# Patient Record
Sex: Female | Born: 1940
Health system: Southern US, Community
[De-identification: ages and names within clinical notes are randomized; demographics above are authoritative.]

## PROBLEM LIST (undated history)

## (undated) DIAGNOSIS — E119 Type 2 diabetes mellitus without complications: Secondary | ICD-10-CM

## (undated) DIAGNOSIS — M1990 Unspecified osteoarthritis, unspecified site: Secondary | ICD-10-CM

## (undated) DIAGNOSIS — E039 Hypothyroidism, unspecified: Secondary | ICD-10-CM

## (undated) DIAGNOSIS — Z860100 Personal history of colon polyps, unspecified: Secondary | ICD-10-CM

## (undated) DIAGNOSIS — K219 Gastro-esophageal reflux disease without esophagitis: Secondary | ICD-10-CM

## (undated) DIAGNOSIS — I10 Essential (primary) hypertension: Secondary | ICD-10-CM

## (undated) DIAGNOSIS — E7800 Pure hypercholesterolemia, unspecified: Secondary | ICD-10-CM

## (undated) DIAGNOSIS — K5790 Diverticulosis of intestine, part unspecified, without perforation or abscess without bleeding: Secondary | ICD-10-CM

## (undated) HISTORY — PX: COLONOSCOPY: SHX174

## (undated) HISTORY — DX: Personal history of colonic polyps: Z86.010

## (undated) HISTORY — DX: Unspecified osteoarthritis, unspecified site: M19.90

## (undated) HISTORY — DX: Personal history of colon polyps, unspecified: Z86.0100

## (undated) HISTORY — DX: Pure hypercholesterolemia, unspecified: E78.00

## (undated) HISTORY — PX: CATARACT EXTRACTION: SUR2

## (undated) HISTORY — DX: Gastro-esophageal reflux disease without esophagitis: K21.9

## (undated) HISTORY — DX: Essential (primary) hypertension: I10

## (undated) HISTORY — DX: Hypothyroidism, unspecified: E03.9

## (undated) HISTORY — DX: Type 2 diabetes mellitus without complications: E11.9

## (undated) HISTORY — DX: Diverticulosis of intestine, part unspecified, without perforation or abscess without bleeding: K57.90

---

## 2020-02-09 ENCOUNTER — Telehealth: Payer: Self-pay | Admitting: Gastroenterology

## 2020-02-09 NOTE — Telephone Encounter (Signed)
HI Dr. Havery Moros,   We received a referral form the patient PCP for a repeat colonoscopy. Previous was Dr. Jake Bathe in 2016 diagnosis: Tubular adenoma of colon will be sending the report for review.   Please advise on scheduling.   Thank you

## 2020-02-14 ENCOUNTER — Encounter: Payer: Self-pay | Admitting: Gastroenterology

## 2020-03-20 NOTE — Telephone Encounter (Signed)
I never received the patients records to review. I don't have it in my office and there is nothing scanned into Epic.  Given her age, I should probably see her in clinic regardless of the findings to determine if she even wishes to have any further exams at all. Can you help book an office visit rather than waiting on records to arrive. Thanks

## 2020-03-20 NOTE — Telephone Encounter (Signed)
Hi Dr. Havery Moros,  Just wanted to follow up on records status.  Please and thank you

## 2020-03-20 NOTE — Telephone Encounter (Signed)
Absolutely, she is on your schedule.   Thanks

## 2020-04-11 ENCOUNTER — Encounter: Payer: Self-pay | Admitting: Gastroenterology

## 2020-04-11 ENCOUNTER — Ambulatory Visit: Payer: Medicare PPO | Admitting: Gastroenterology

## 2020-04-11 VITALS — BP 120/70 | HR 62 | Ht 64.0 in | Wt 150.8 lb

## 2020-04-11 DIAGNOSIS — K219 Gastro-esophageal reflux disease without esophagitis: Secondary | ICD-10-CM

## 2020-04-11 DIAGNOSIS — Z86010 Personal history of colonic polyps: Secondary | ICD-10-CM | POA: Diagnosis not present

## 2020-04-11 MED ORDER — SUTAB 1479-225-188 MG PO TABS: 1.0000 | ORAL_TABLET | Freq: Once | ORAL | 0 refills | Status: AC

## 2020-04-11 NOTE — Patient Instructions (Addendum)
If you are age 80 or older, your body mass index should be between 23-30. Your Body mass index is 25.88 kg/m. If this is out of the aforementioned range listed, please consider follow up with your Primary Care Provider.  If you are age 67 or younger, your body mass index should be between 19-25. Your Body mass index is 25.88 kg/m. If this is out of the aformentioned range listed, please consider follow up with your Primary Care Provider.    You have been scheduled for a colonoscopy. Please follow written instructions given to you at your visit today.  Please pick up your prep supplies at the pharmacy within the next 1-3 days. If you use inhalers (even only as needed), please bring them with you on the day of your procedure.   We are giving you a sample of SUTAB today due to the nearness of your procedure.   Thank you for entrusting me with your care and for choosing [facility], Dr. Ileene Patrick

## 2020-04-11 NOTE — Progress Notes (Signed)
HPI :  80 year old female with a history of colon polyps, GERD, hypertension, hyperlipidemia, referred by Velna Hatchet MD to discuss colonoscopy in light of history of colon polyps.  The patient states she moved to [place] within the past year from [place].  She states over time she has been having colonoscopy every 3 to 5 years for multiple polyps she has had removed in the past.  Her colonoscopy report is available from her last exam only, done in October 2016, good prep noted and no polyps seen but her gastroenterologist recommended a repeat colonoscopy in 5 years due to her history of prior polyps removed.  The patient is hopeful to proceed with colonoscopy.  She has no family history of colon cancer, no bowel troubles.  She denies any blood in her stools.  She is otherwise in very good health.  She does take omeprazole 20 mg once daily for history of reflux.  She states this is worked very well for her over time to control her symptoms.  She has previously been on Zantac to which she developed swelling/angioedema and has an allergy listed to that.  She is also used excessive amounts of Tums in the past and she states she had problems with hypercalcemia and so she has been managing this with omeprazole for a long time.  She denies any dysphagia.  No family history of esophageal cancer.  She has had an EGD years ago and states she had no evidence of Barrett's esophagus that she is aware of.  He denies any history of chronic kidney disease.    Colonoscopy 01/23/2015 - [place] - Dr. Genella Mech - good prep, mild sigmoid diverticulosis, told to have another colonoscopy in 5 years due to "history of polyps"   Past Medical History:  Diagnosis Date  . Arthritis   . Diverticulosis   . GERD (gastroesophageal reflux disease)   . High cholesterol   . History of colon polyps   . Hypertension   . Hypothyroid      Past Surgical History:  Procedure Laterality Date  .  CATARACT EXTRACTION    . COLONOSCOPY     Family History  Problem Relation Age of Onset  . Healthy Mother   . Heart disease Father   . Healthy Sister   . Colon cancer Neg Hx   . Esophageal cancer Neg Hx   . Pancreatic cancer Neg Hx   . Stomach cancer Neg Hx   . Liver disease Neg Hx    Social History   Tobacco Use  . Smoking status: Never Smoker  . Smokeless tobacco: Never Used  Vaping Use  . Vaping Use: Never used  Substance Use Topics  . Alcohol use: Never  . Drug use: Never   Current Outpatient Medications  Medication Sig Dispense Refill  . amLODipine (NORVASC) 2.5 MG tablet Take 2.5 mg by mouth daily.    Marland Kitchen levothyroxine (SYNTHROID) 50 MCG tablet Take 50 mcg by mouth every other day. ALTERNATES WITH 75MCG TABLET    . levothyroxine (SYNTHROID) 75 MCG tablet Take 75 mcg by mouth every other day. ALTERNATES WITH 50 MCG TABLET    . omeprazole (PRILOSEC) 20 MG capsule Take 20 mg by mouth daily.    . simvastatin (ZOCOR) 10 MG tablet Take 10 mg by mouth daily.    . Sodium Sulfate-Mag Sulfate-KCl (SUTAB) [phone] MG TABS Take 1 kit by mouth once for 1 dose. 24 tablet 0   No current facility-administered medications for this visit.  Allergies  Allergen Reactions  . Zantac [Ranitidine Hcl] Anaphylaxis    Facial swelling. Denies dyspnea     Review of Systems: All systems reviewed and negative except where noted in HPI.   No results found for: WBC, HGB, HCT, MCV, PLT  No results found for: CREATININE, BUN, NA, K, CL, CO2  NO labs on file available  Physical Exam: BP 120/70   Pulse 62   Ht $R'5\' 4"'rA$  (1.626 m)   Wt 150 lb 12.8 oz (68.4 kg)   SpO2 96%   BMI 25.88 kg/m  Constitutional: Pleasant,well-developed, female in no acute distress. HEENT: Normocephalic and atraumatic. Conjunctivae are normal. No scleral icterus. Neck supple.  Cardiovascular: Normal rate, regular rhythm.  Pulmonary/chest: Effort normal and breath sounds normal.  Abdominal: Soft, nondistended,  nontender. . There are no masses palpable.  Extremities: no edema Lymphadenopathy: No cervical adenopathy noted. Neurological: Alert and oriented to person place and time. Skin: Skin is warm and dry. No rashes noted. Psychiatric: Normal mood and affect. Behavior is normal.   ASSESSMENT AND PLAN: 80 year old female here for reassessment the following:  History of colon polyps - numerous adenomas removed in the past per her report.  Her last colonoscopy was 5 years ago and she had no polyps however her gastroenterologist recommended a 5-year follow-up based on exams she has had in the past.  Patient strongly wishes to have another colonoscopy for surveillance purposes prior to stopping exams.  She is quite healthy without any significant comorbidities for her age, I think reasonable to proceed with 1 more colonoscopy.  I discussed risk and benefits of colonoscopy and anesthesia with her and she wanted to proceed, further recommendations pending results.  GERD - longstanding symptoms with prior EGD reportedly showing no evidence of Barrett's.  She has had an allergic reaction to Zantac, anxious about trying something else like Pepcid.  She has not done well with Tums in the past and states she requires PPI to control symptoms.  We discussed long-term risks associated with chronic PPI use, she understands, will continue therapy for now  Ocotillo Cellar, MD [facility]  CC: Velna Hatchet, MD

## 2020-04-12 ENCOUNTER — Encounter: Payer: Self-pay | Admitting: Gastroenterology

## 2020-04-12 ENCOUNTER — Other Ambulatory Visit: Payer: Self-pay

## 2020-04-12 ENCOUNTER — Ambulatory Visit (AMBULATORY_SURGERY_CENTER): Payer: Medicare PPO | Admitting: Gastroenterology

## 2020-04-12 VITALS — BP 106/61 | HR 53 | Temp 96.0°F | Resp 11 | Ht 64.0 in | Wt 150.0 lb

## 2020-04-12 DIAGNOSIS — D120 Benign neoplasm of cecum: Secondary | ICD-10-CM

## 2020-04-12 DIAGNOSIS — D128 Benign neoplasm of rectum: Secondary | ICD-10-CM

## 2020-04-12 DIAGNOSIS — D127 Benign neoplasm of rectosigmoid junction: Secondary | ICD-10-CM | POA: Diagnosis not present

## 2020-04-12 DIAGNOSIS — I10 Essential (primary) hypertension: Secondary | ICD-10-CM | POA: Diagnosis not present

## 2020-04-12 DIAGNOSIS — D123 Benign neoplasm of transverse colon: Secondary | ICD-10-CM | POA: Diagnosis not present

## 2020-04-12 DIAGNOSIS — K219 Gastro-esophageal reflux disease without esophagitis: Secondary | ICD-10-CM | POA: Diagnosis not present

## 2020-04-12 DIAGNOSIS — D126 Benign neoplasm of colon, unspecified: Secondary | ICD-10-CM | POA: Diagnosis not present

## 2020-04-12 DIAGNOSIS — Z86010 Personal history of colonic polyps: Secondary | ICD-10-CM | POA: Diagnosis not present

## 2020-04-12 MED ORDER — SODIUM CHLORIDE 0.9 % IV SOLN
500.0000 mL | Freq: Once | INTRAVENOUS | Status: DC
Start: 2020-04-12 — End: 2020-04-12

## 2020-04-12 NOTE — Progress Notes (Signed)
Report given to PACU, vss 

## 2020-04-12 NOTE — Progress Notes (Signed)
Called to room to assist during endoscopic procedure.  Patient ID and intended procedure confirmed with present staff. Received instructions for my participation in the procedure from the performing physician.  

## 2020-04-12 NOTE — Op Note (Signed)
[place] Patient Name: Catherine Castaneda Procedure Date: 04/12/2020 1:07 PM MRN: [id number] Endoscopist: Remo Lipps P. Havery Moros , MD Age: 80 Referring MD:  Date of Birth: January 31, 1941 Gender: Female Account #: [id number] Procedure:                Colonoscopy Indications:              High risk colon cancer surveillance: Personal                            history of colonic polyps Medicines:                Monitored Anesthesia Care Procedure:                Pre-Anesthesia Assessment:                           - Prior to the procedure, a History and Physical                            was performed, and patient medications and                            allergies were reviewed. The patient's tolerance of                            previous anesthesia was also reviewed. The risks                            and benefits of the procedure and the sedation                            options and risks were discussed with the patient.                            All questions were answered, and informed consent                            was obtained. Prior Anticoagulants: The patient has                            taken no previous anticoagulant or antiplatelet                            agents. ASA Grade Assessment: II - A patient with                            mild systemic disease. After reviewing the risks                            and benefits, the patient was deemed in                            satisfactory condition to undergo the procedure.  After obtaining informed consent, the colonoscope                            was passed under direct vision. Throughout the                            procedure, the patient's blood pressure, pulse, and                            oxygen saturations were monitored continuously. The                            Olympus PCF-H190DL (#[id number]) Colonoscope was                            introduced through the anus and  advanced to the the                            cecum, identified by appendiceal orifice and                            ileocecal valve. The colonoscopy was performed                            without difficulty. The patient tolerated the                            procedure well. The quality of the bowel                            preparation was adequate. The ileocecal valve,                            appendiceal orifice, and rectum were photographed. Scope In: 1:31:55 PM Scope Out: 1:54:44 PM Scope Withdrawal Time: 0 hours 17 minutes 12 seconds  Total Procedure Duration: 0 hours 22 minutes 49 seconds  Findings:                 The perianal and digital rectal examinations were                            normal.                           A 3 to 4 mm polyp was found in the cecum. The polyp                            was sessile. The polyp was removed with a cold                            snare. Resection and retrieval were complete.                           Two flat polyps were found in the hepatic flexure.  The polyps were 3 to 6 mm in size. These polyps                            were removed with a cold snare. Resection and                            retrieval were complete.                           Two flat polyps were found in the transverse colon.                            The polyps were 4 mm in size. These polyps were                            removed with a cold snare. Resection and retrieval                            were complete.                           A 6 mm polyp was found in the recto-sigmoid colon.                            The polyp was flat. The polyp was removed with a                            cold snare. Resection and retrieval were complete.                           A 4 mm polyp was found in the rectum. The polyp was                            sessile. The polyp was removed with a cold snare.                             Resection and retrieval were complete.                           Multiple small-mouthed diverticula were found in                            the sigmoid colon and descending colon.                           Anal papilla(e) were hypertrophied.                           The exam was otherwise without abnormality. Complications:            No immediate complications. Estimated blood loss:                            Minimal. Estimated  Blood Loss:     Estimated blood loss was minimal. Impression:               - One 3 to 4 mm polyp in the cecum, removed with a                            cold snare. Resected and retrieved.                           - Two 3 to 6 mm polyps at the hepatic flexure,                            removed with a cold snare. Resected and retrieved.                           - Two 4 mm polyps in the transverse colon, removed                            with a cold snare. Resected and retrieved.                           - One 6 mm polyp at the recto-sigmoid colon,                            removed with a cold snare. Resected and retrieved.                           - One 4 mm polyp in the rectum, removed with a cold                            snare. Resected and retrieved.                           - Diverticulosis in the sigmoid colon and in the                            descending colon.                           - Anal papilla(e) were hypertrophied.                           - The examination was otherwise normal. Recommendation:           - Patient has a contact number available for                            emergencies. The signs and symptoms of potential                            delayed complications were discussed with the                            patient. Return to normal activities tomorrow.  Written discharge instructions were provided to the                            patient.                           - Resume previous diet.                            - Continue present medications.                           - Await pathology results. Remo Lipps P. Elmarie Devlin, MD 04/12/2020 2:00:23 PM This report has been signed electronically.

## 2020-04-12 NOTE — Progress Notes (Signed)
Pt was alseep when Dr. Adela Lank came to go over the findings.  Per Dr. Adela Lank, he will do the next procedure and talk with her afterwards.    No problems noted in the recovery room. maw

## 2020-04-12 NOTE — Progress Notes (Signed)
Pt's states no medical or surgical changes since previsit or office visit. 

## 2020-04-12 NOTE — Patient Instructions (Addendum)
Handouts were given to you on polyps and diverticulosis. You may resume your current medications today. Await biopsy results.  May take 2-3 weeks to receive pathology results. Please call if any questions or concerns.     YOU HAD AN ENDOSCOPIC PROCEDURE TODAY AT THE [facility]:   Refer to the procedure report that was given to you for any specific questions about what was found during the examination.  If the procedure report does not answer your questions, please call your gastroenterologist to clarify.  If you requested that your care partner not be given the details of your procedure findings, then the procedure report has been included in a sealed envelope for you to review at your convenience later.  YOU SHOULD EXPECT: Some feelings of bloating in the abdomen. Passage of more gas than usual.  Walking can help get rid of the air that was put into your GI tract during the procedure and reduce the bloating. If you had a lower endoscopy (such as a colonoscopy or flexible sigmoidoscopy) you may notice spotting of blood in your stool or on the toilet paper. If you underwent a bowel prep for your procedure, you may not have a normal bowel movement for a few days.  Please Note:  You might notice some irritation and congestion in your nose or some drainage.  This is from the oxygen used during your procedure.  There is no need for concern and it should clear up in a day or so.  SYMPTOMS TO REPORT IMMEDIATELY:   Following lower endoscopy (colonoscopy or flexible sigmoidoscopy):  Excessive amounts of blood in the stool  Significant tenderness or worsening of abdominal pains  Swelling of the abdomen that is new, acute  Fever of 100F or higher   For urgent or emergent issues, a gastroenterologist can be reached at any hour by calling [phone]. Do not use MyChart messaging for urgent concerns.    DIET:  We do recommend a small meal at first, but then you may proceed to your  regular diet.  Drink plenty of fluids but you should avoid alcoholic beverages for 24 hours.  ACTIVITY:  You should plan to take it easy for the rest of today and you should NOT DRIVE or use heavy machinery until tomorrow (because of the sedation medicines used during the test).    FOLLOW UP: Our staff will call the number listed on your records 48-72 hours following your procedure to check on you and address any questions or concerns that you may have regarding the information given to you following your procedure. If we do not reach you, we will leave a message.  We will attempt to reach you two times.  During this call, we will ask if you have developed any symptoms of COVID 19. If you develop any symptoms (ie: fever, flu-like symptoms, shortness of breath, cough etc.) before then, please call [phone].  If you test positive for Covid 19 in the 2 weeks post procedure, please call and report this information to us.    If any biopsies were taken you will be contacted by phone or by letter within the next 1-3 weeks.  Please call us at [phone] if you have not heard about the biopsies in 3 weeks.    SIGNATURES/CONFIDENTIALITY: You and/or your care partner have signed paperwork which will be entered into your electronic medical record.  These signatures attest to the fact that that the information above on your After Visit Summary has   Visit Summary has been reviewed and is understood.  Full responsibility of the confidentiality of this discharge information lies with you and/or your care-partner.

## 2020-04-13 DIAGNOSIS — E039 Hypothyroidism, unspecified: Secondary | ICD-10-CM | POA: Diagnosis not present

## 2020-04-13 DIAGNOSIS — M859 Disorder of bone density and structure, unspecified: Secondary | ICD-10-CM | POA: Diagnosis not present

## 2020-04-13 DIAGNOSIS — E785 Hyperlipidemia, unspecified: Secondary | ICD-10-CM | POA: Diagnosis not present

## 2020-04-16 ENCOUNTER — Telehealth: Payer: Self-pay

## 2020-04-16 DIAGNOSIS — N1831 Chronic kidney disease, stage 3a: Secondary | ICD-10-CM | POA: Diagnosis not present

## 2020-04-16 NOTE — Telephone Encounter (Signed)
  Follow up Call-  Call back number 04/12/2020  Post procedure Call Back phone  # [phone]  Permission to leave phone message Yes     Patient questions:  Do you have a fever, pain , or abdominal swelling? No. Pain Score  0 *  Have you tolerated food without any problems? Yes.    Have you been able to return to your normal activities? Yes.    Do you have any questions about your discharge instructions: Diet   No. Medications  No. Follow up visit  No.  Do you have questions or concerns about your Care? No.  Actions: * If pain score is 4 or above: No action needed, pain <4.   1. Have you developed a fever since your procedure? no  2.   Have you had an respiratory symptoms (SOB or cough) since your procedure? no  3.   Have you tested positive for COVID 19 since your procedure no  4.   Have you had any family members/close contacts diagnosed with the COVID 19 since your procedure?  no   If yes to any of these questions please route to Joylene John, RN and Joella Prince, RN

## 2020-04-19 ENCOUNTER — Encounter: Payer: Self-pay | Admitting: Gastroenterology

## 2020-04-20 ENCOUNTER — Other Ambulatory Visit: Payer: Self-pay | Admitting: Internal Medicine

## 2020-04-20 DIAGNOSIS — E039 Hypothyroidism, unspecified: Secondary | ICD-10-CM | POA: Diagnosis not present

## 2020-04-20 DIAGNOSIS — E559 Vitamin D deficiency, unspecified: Secondary | ICD-10-CM | POA: Diagnosis not present

## 2020-04-20 DIAGNOSIS — Z1231 Encounter for screening mammogram for malignant neoplasm of breast: Secondary | ICD-10-CM

## 2020-04-20 DIAGNOSIS — R7303 Prediabetes: Secondary | ICD-10-CM | POA: Diagnosis not present

## 2020-04-20 DIAGNOSIS — M8580 Other specified disorders of bone density and structure, unspecified site: Secondary | ICD-10-CM | POA: Diagnosis not present

## 2020-04-20 DIAGNOSIS — Z1339 Encounter for screening examination for other mental health and behavioral disorders: Secondary | ICD-10-CM | POA: Diagnosis not present

## 2020-04-20 DIAGNOSIS — Z1331 Encounter for screening for depression: Secondary | ICD-10-CM | POA: Diagnosis not present

## 2020-04-20 DIAGNOSIS — R82998 Other abnormal findings in urine: Secondary | ICD-10-CM | POA: Diagnosis not present

## 2020-04-20 DIAGNOSIS — E785 Hyperlipidemia, unspecified: Secondary | ICD-10-CM | POA: Diagnosis not present

## 2020-04-20 DIAGNOSIS — Z0000 Encounter for general adult medical examination without abnormal findings: Secondary | ICD-10-CM | POA: Diagnosis not present

## 2020-04-20 DIAGNOSIS — N1831 Chronic kidney disease, stage 3a: Secondary | ICD-10-CM | POA: Diagnosis not present

## 2020-07-18 DIAGNOSIS — B029 Zoster without complications: Secondary | ICD-10-CM | POA: Diagnosis not present

## 2020-07-23 ENCOUNTER — Ambulatory Visit: Payer: Medicare PPO

## 2020-09-10 ENCOUNTER — Other Ambulatory Visit: Payer: Self-pay

## 2020-09-10 ENCOUNTER — Ambulatory Visit
Admission: RE | Admit: 2020-09-10 | Discharge: 2020-09-10 | Disposition: A | Discharge status: Home / Self Care | Payer: Medicare PPO | Source: Ambulatory Visit | Attending: Internal Medicine | Admitting: Internal Medicine

## 2020-09-10 DIAGNOSIS — Z1231 Encounter for screening mammogram for malignant neoplasm of breast: Secondary | ICD-10-CM | POA: Diagnosis not present

## 2020-10-23 DIAGNOSIS — R7303 Prediabetes: Secondary | ICD-10-CM | POA: Diagnosis not present

## 2020-10-23 DIAGNOSIS — N1831 Chronic kidney disease, stage 3a: Secondary | ICD-10-CM | POA: Diagnosis not present

## 2020-10-23 DIAGNOSIS — B029 Zoster without complications: Secondary | ICD-10-CM | POA: Diagnosis not present

## 2020-10-23 DIAGNOSIS — E785 Hyperlipidemia, unspecified: Secondary | ICD-10-CM | POA: Diagnosis not present

## 2020-10-23 DIAGNOSIS — E039 Hypothyroidism, unspecified: Secondary | ICD-10-CM | POA: Diagnosis not present

## 2020-10-23 DIAGNOSIS — E559 Vitamin D deficiency, unspecified: Secondary | ICD-10-CM | POA: Diagnosis not present

## 2020-10-23 DIAGNOSIS — M8580 Other specified disorders of bone density and structure, unspecified site: Secondary | ICD-10-CM | POA: Diagnosis not present

## 2020-11-16 DIAGNOSIS — H52223 Regular astigmatism, bilateral: Secondary | ICD-10-CM | POA: Diagnosis not present

## 2020-11-16 DIAGNOSIS — Z961 Presence of intraocular lens: Secondary | ICD-10-CM | POA: Diagnosis not present

## 2020-11-16 DIAGNOSIS — H35351 Cystoid macular degeneration, right eye: Secondary | ICD-10-CM | POA: Diagnosis not present

## 2020-11-16 DIAGNOSIS — H5212 Myopia, left eye: Secondary | ICD-10-CM | POA: Diagnosis not present

## 2020-11-16 DIAGNOSIS — H5201 Hypermetropia, right eye: Secondary | ICD-10-CM | POA: Diagnosis not present

## 2021-04-23 DIAGNOSIS — E559 Vitamin D deficiency, unspecified: Secondary | ICD-10-CM | POA: Diagnosis not present

## 2021-04-23 DIAGNOSIS — E785 Hyperlipidemia, unspecified: Secondary | ICD-10-CM | POA: Diagnosis not present

## 2021-04-23 DIAGNOSIS — M8580 Other specified disorders of bone density and structure, unspecified site: Secondary | ICD-10-CM | POA: Diagnosis not present

## 2021-04-23 DIAGNOSIS — E1169 Type 2 diabetes mellitus with other specified complication: Secondary | ICD-10-CM | POA: Diagnosis not present

## 2021-04-23 DIAGNOSIS — R7303 Prediabetes: Secondary | ICD-10-CM | POA: Diagnosis not present

## 2021-04-23 DIAGNOSIS — E039 Hypothyroidism, unspecified: Secondary | ICD-10-CM | POA: Diagnosis not present

## 2021-04-30 DIAGNOSIS — E785 Hyperlipidemia, unspecified: Secondary | ICD-10-CM | POA: Diagnosis not present

## 2021-04-30 DIAGNOSIS — Z23 Encounter for immunization: Secondary | ICD-10-CM | POA: Diagnosis not present

## 2021-04-30 DIAGNOSIS — N1831 Chronic kidney disease, stage 3a: Secondary | ICD-10-CM | POA: Diagnosis not present

## 2021-04-30 DIAGNOSIS — Z1339 Encounter for screening examination for other mental health and behavioral disorders: Secondary | ICD-10-CM | POA: Diagnosis not present

## 2021-04-30 DIAGNOSIS — E559 Vitamin D deficiency, unspecified: Secondary | ICD-10-CM | POA: Diagnosis not present

## 2021-04-30 DIAGNOSIS — E1169 Type 2 diabetes mellitus with other specified complication: Secondary | ICD-10-CM | POA: Diagnosis not present

## 2021-04-30 DIAGNOSIS — Z1331 Encounter for screening for depression: Secondary | ICD-10-CM | POA: Diagnosis not present

## 2021-04-30 DIAGNOSIS — E039 Hypothyroidism, unspecified: Secondary | ICD-10-CM | POA: Diagnosis not present

## 2021-04-30 DIAGNOSIS — M8580 Other specified disorders of bone density and structure, unspecified site: Secondary | ICD-10-CM | POA: Diagnosis not present

## 2021-04-30 DIAGNOSIS — Z0000 Encounter for general adult medical examination without abnormal findings: Secondary | ICD-10-CM | POA: Diagnosis not present

## 2021-05-20 DIAGNOSIS — H52223 Regular astigmatism, bilateral: Secondary | ICD-10-CM | POA: Diagnosis not present

## 2021-05-20 DIAGNOSIS — Z961 Presence of intraocular lens: Secondary | ICD-10-CM | POA: Diagnosis not present

## 2021-05-20 DIAGNOSIS — H5212 Myopia, left eye: Secondary | ICD-10-CM | POA: Diagnosis not present

## 2021-05-20 DIAGNOSIS — H35351 Cystoid macular degeneration, right eye: Secondary | ICD-10-CM | POA: Diagnosis not present

## 2021-05-21 ENCOUNTER — Encounter: Payer: Medicare PPO | Attending: Internal Medicine | Admitting: Dietician

## 2021-05-21 ENCOUNTER — Other Ambulatory Visit: Payer: Self-pay

## 2021-05-21 ENCOUNTER — Encounter: Payer: Self-pay | Admitting: Dietician

## 2021-05-21 DIAGNOSIS — Z713 Dietary counseling and surveillance: Secondary | ICD-10-CM | POA: Insufficient documentation

## 2021-05-21 DIAGNOSIS — E119 Type 2 diabetes mellitus without complications: Secondary | ICD-10-CM

## 2021-05-21 DIAGNOSIS — E1169 Type 2 diabetes mellitus with other specified complication: Secondary | ICD-10-CM | POA: Diagnosis not present

## 2021-05-21 NOTE — Progress Notes (Signed)
Patient was seen on 05/21/2021 for the first of a series of three diabetes self-management courses at the [facility].  Patient Education Plan per assessed needs and concerns is to attend three course education program for Diabetes Self Management Education.  A1C was 6.7% on 04/30/2021.  The following learning objectives were met by the patient during this class: Describe diabetes, types of diabetes and pathophysiology State some common risk factors for diabetes Defines the role of glucose and insulin Describe the relationship between diabetes and cardiovascular and other risks State the members of the Healthcare Team States the rationale for glucose monitoring and when to test State their individual Provencal the importance of logging glucose readings and how to interpret the readings Identifies A1C target Explain the correlation between A1c and eAG values State symptoms and treatment of high blood glucose and low blood glucose Explain proper technique for glucose testing and identify proper sharps disposal  Handouts given during class include: How to Thrive:  A Guide for Your Journey with Diabetes by the ADA Meal Plan Card and carbohydrate content list Dietary intake form Low Sodium Flavoring Tips Types of Fats Dining Out Label reading Snack list The diabetes portion plate Diabetes Resources A1c to eAG Conversion Chart Blood Glucose Log Diabetes Recommended Care Schedule Support Group Diabetes Success Plan Core Class Satisfaction Survey   Follow-Up Plan: Attend core 2

## 2021-05-28 ENCOUNTER — Encounter: Payer: Medicare PPO | Admitting: Dietician

## 2021-05-28 ENCOUNTER — Other Ambulatory Visit: Payer: Self-pay

## 2021-05-28 DIAGNOSIS — Z713 Dietary counseling and surveillance: Secondary | ICD-10-CM | POA: Diagnosis not present

## 2021-05-28 DIAGNOSIS — E1169 Type 2 diabetes mellitus with other specified complication: Secondary | ICD-10-CM | POA: Diagnosis not present

## 2021-05-28 DIAGNOSIS — E119 Type 2 diabetes mellitus without complications: Secondary | ICD-10-CM

## 2021-06-03 ENCOUNTER — Encounter: Payer: Self-pay | Admitting: Dietician

## 2021-06-03 NOTE — Progress Notes (Signed)
Patient was seen on 05/28/2021 for the second of a series of three diabetes self-management courses at the [facility]. The following learning objectives were met by the patient during this class: ° °Describe the role of different macronutrients on glucose °Explain how carbohydrates affect blood glucose °State what foods contain the most carbohydrates °Demonstrate carbohydrate counting °Demonstrate how to read Nutrition Facts food label °Describe effects of various fats on heart health °Describe the importance of good nutrition for health and healthy eating strategies °Describe techniques for managing your shopping, cooking and meal planning °List strategies to follow meal plan when dining out °Describe the effects of alcohol on glucose and how to use it safely ° °Goals:  °Follow Diabetes Meal Plan as instructed  °Aim to spread carbs evenly throughout the day  °Aim for 3 meals per day and snacks as needed °Include lean protein foods to meals/snacks  °Monitor glucose levels as instructed by your doctor  ° °Follow-Up Plan: °Attend Core 3 °Work towards following your personal food plan.  °

## 2021-06-04 ENCOUNTER — Encounter: Payer: Self-pay | Admitting: Dietician

## 2021-06-04 ENCOUNTER — Encounter: Payer: Medicare PPO | Admitting: Dietician

## 2021-06-04 ENCOUNTER — Other Ambulatory Visit: Payer: Self-pay

## 2021-06-04 DIAGNOSIS — E1169 Type 2 diabetes mellitus with other specified complication: Secondary | ICD-10-CM | POA: Diagnosis not present

## 2021-06-04 DIAGNOSIS — Z713 Dietary counseling and surveillance: Secondary | ICD-10-CM | POA: Diagnosis not present

## 2021-06-04 DIAGNOSIS — E119 Type 2 diabetes mellitus without complications: Secondary | ICD-10-CM

## 2021-06-04 NOTE — Progress Notes (Signed)
Patient was seen on 06/04/2021 for the third of a series of three diabetes self-management courses at the [facility].   State the amount of activity recommended for healthy living Describe activities suitable for individual needs Identify ways to regularly incorporate activity into daily life Identify barriers to activity and ways to over come these barriers Identify diabetes medications being personally used and their primary action for lowering glucose and possible side effects Describe role of stress on blood glucose and develop strategies to address psychosocial issues Identify diabetes complications and ways to prevent them Explain how to manage diabetes during illness Evaluate success in meeting personal goal Establish 2-3 goals that they will plan to diligently work on  Goals:  Portion control and paying attention to protion plate I will be active 30 minutes or more 5 times a week at the gym as well as walking after lunch and or dinner consistently  Your patient has identified these potential barriers to change:  Lack of attention to consistency  Your patient has identified their diabetes self-care support plan as  On-line Resources [facility] Support Group  American Diabetes Association Website    Plan:  Attend Support Group as desired

## 2021-06-06 DIAGNOSIS — D2261 Melanocytic nevi of right upper limb, including shoulder: Secondary | ICD-10-CM | POA: Diagnosis not present

## 2021-06-06 DIAGNOSIS — D2272 Melanocytic nevi of left lower limb, including hip: Secondary | ICD-10-CM | POA: Diagnosis not present

## 2021-06-06 DIAGNOSIS — L570 Actinic keratosis: Secondary | ICD-10-CM | POA: Diagnosis not present

## 2021-06-06 DIAGNOSIS — L821 Other seborrheic keratosis: Secondary | ICD-10-CM | POA: Diagnosis not present

## 2021-06-06 DIAGNOSIS — D2262 Melanocytic nevi of left upper limb, including shoulder: Secondary | ICD-10-CM | POA: Diagnosis not present

## 2021-06-06 DIAGNOSIS — I788 Other diseases of capillaries: Secondary | ICD-10-CM | POA: Diagnosis not present

## 2021-07-18 ENCOUNTER — Encounter: Payer: Self-pay | Admitting: Dietician

## 2021-07-18 ENCOUNTER — Encounter: Payer: Medicare PPO | Attending: Internal Medicine | Admitting: Dietician

## 2021-07-18 DIAGNOSIS — E119 Type 2 diabetes mellitus without complications: Secondary | ICD-10-CM

## 2021-07-18 DIAGNOSIS — Z713 Dietary counseling and surveillance: Secondary | ICD-10-CM | POA: Insufficient documentation

## 2021-07-18 DIAGNOSIS — E1169 Type 2 diabetes mellitus with other specified complication: Secondary | ICD-10-CM | POA: Diagnosis not present

## 2021-07-18 NOTE — Patient Instructions (Signed)
Continue the great changes that you have made! ? Exercise ? mindfulness ?

## 2021-07-18 NOTE — Progress Notes (Signed)
Appointment start:  1410 Appointment End:  1505 ? ?Patient attended Diabetes Core Classes 1-3 between 05/21/2021 and 06/04/2021 at [facility]. ?The purpose of the meeting today is to review information learned during those classes as well as review patient application and goals. ? ? ?What are one or two positive things that you are doing right now to manage your diabetes?  Going to the gym 3-4 days per week silver sneakers classes, treadmill, weight machines (1.5 hours) and is walking 30 days other days as able, he is reducing her portion sizes (less bread and overall no sweets, less mayo and less butter) ? ?What is the hardest part about your diabetes right now, causing you the most concern, or is the most worrisome to you about your diabetes?  What is my blood glucose doing?  Is it too high and I don't know it? ? ?What questions do you have today?  About diet.  Can I have what I want at times if I watch my portions? ? ?Have you participated in any diabetes support group?  none ? ?History:  Type 2 Diabetes, HLD, HTN, hypothyroid, GERD ?A1C:  6.7 % 04/30/2021 ?Medications include:  none for diabetes ?Sleep:  good (wakes 3-4 times per night to urinate) ?Weight:  145 lbs 4/13/023 decreased from 150 lbs 04/11/2021 ?Blood Glucose:  not testing at home ? 2 hours after starting a meal:  149 today in the office 1 hour after a meal ? Have you had any low blood sugar readings in the past month?  none ? ?Social History:  Patient lives with her husband.  Works one day per week at the [employer].  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 ?Moved to [place] from [place] 2 years ago.  Retired from [employer]. ?Exercise:  Going to the gym 3-4 days per week silver sneakers classes, treadmill, weight machines (1.5 hours) and is walking 30 days other days as able ? ?24 hour diet recall: ?Breakfast:  boiled egg, 1/2 cup greek yogurt, blueberries, occasional sausage, V-8 vegetable juice at  times ?Snack:  walnuts or grapes or 1/2 banana or orange ?Lunch:  grilled cheese sandwich or cheese sandwich or tuna sandwich or tuna and crackers or soup (vegetable beef soup), fruit ?Snack:  occasional walnuts or rare protein bar ?Dinner 6:00 pm:  baked or boiled or crock pot meat, rice or potatoes, vegetables OR soup and crackers and salad OR Mexican bowl AND fruit ?Snack:  occasional fruit or walnuts (sometimes habit) ?Beverages:  water, coffee with splenda ? ? ?Specific focus but not limited to the following: ?Review of blood glucose monitoring and interpretation including the recommended target ranges and Hgb A1c.  ?Review of carbohydrate counting, importance of regularly scheduled meals/snacks, and meal planning to improve quality of diet. ?Review of the effects of physical activity on glucose levels and long-term glucose control.  Recommended goal of 150 minutes of physical activity/week. ?Discussion of strategies to manage stress, psychosocial issues, and other obstacles to diabetes management. ?Review of prevention, detection, and treatment of long-term complications.  Discussion of the role of prolonged elevated glucose levels on body systems. ? ?Continuing Goals: ?Continue lifestyle habits:  exercise and mindful eating ? ?Future Follow up:  11/2021 ?

## 2021-08-01 ENCOUNTER — Other Ambulatory Visit: Payer: Self-pay | Admitting: Internal Medicine

## 2021-08-01 DIAGNOSIS — Z1231 Encounter for screening mammogram for malignant neoplasm of breast: Secondary | ICD-10-CM

## 2021-09-11 ENCOUNTER — Ambulatory Visit
Admission: RE | Admit: 2021-09-11 | Discharge: 2021-09-11 | Disposition: A | Discharge status: Home / Self Care | Payer: Medicare PPO | Source: Ambulatory Visit | Attending: Internal Medicine | Admitting: Internal Medicine

## 2021-09-11 DIAGNOSIS — Z1231 Encounter for screening mammogram for malignant neoplasm of breast: Secondary | ICD-10-CM | POA: Diagnosis not present

## 2021-10-29 DIAGNOSIS — N1831 Chronic kidney disease, stage 3a: Secondary | ICD-10-CM | POA: Diagnosis not present

## 2021-10-29 DIAGNOSIS — M8580 Other specified disorders of bone density and structure, unspecified site: Secondary | ICD-10-CM | POA: Diagnosis not present

## 2021-10-29 DIAGNOSIS — E785 Hyperlipidemia, unspecified: Secondary | ICD-10-CM | POA: Diagnosis not present

## 2021-10-29 DIAGNOSIS — E1169 Type 2 diabetes mellitus with other specified complication: Secondary | ICD-10-CM | POA: Diagnosis not present

## 2021-10-29 DIAGNOSIS — E039 Hypothyroidism, unspecified: Secondary | ICD-10-CM | POA: Diagnosis not present

## 2021-10-29 DIAGNOSIS — E559 Vitamin D deficiency, unspecified: Secondary | ICD-10-CM | POA: Diagnosis not present

## 2021-11-19 DIAGNOSIS — H5212 Myopia, left eye: Secondary | ICD-10-CM | POA: Diagnosis not present

## 2021-11-19 DIAGNOSIS — H35351 Cystoid macular degeneration, right eye: Secondary | ICD-10-CM | POA: Diagnosis not present

## 2021-11-19 DIAGNOSIS — H52223 Regular astigmatism, bilateral: Secondary | ICD-10-CM | POA: Diagnosis not present

## 2021-11-19 DIAGNOSIS — Z961 Presence of intraocular lens: Secondary | ICD-10-CM | POA: Diagnosis not present

## 2021-11-19 DIAGNOSIS — E119 Type 2 diabetes mellitus without complications: Secondary | ICD-10-CM | POA: Diagnosis not present

## 2021-12-02 ENCOUNTER — Encounter: Payer: Self-pay | Admitting: Dietician

## 2021-12-02 ENCOUNTER — Encounter: Payer: Medicare PPO | Attending: Internal Medicine | Admitting: Dietician

## 2021-12-02 DIAGNOSIS — E119 Type 2 diabetes mellitus without complications: Secondary | ICD-10-CM | POA: Diagnosis not present

## 2021-12-02 NOTE — Progress Notes (Signed)
Appointment start:  1050 Appointment End:  1120  Patient attended Diabetes Core Classes 1-3 between 05/21/2021 and 06/04/2021 at [facility]. The purpose of the meeting today is to review information learned during those classes as well as review patient application and goals. She was last seen for a follow up by this RD on 07/18/2021.  What are one or two positive things that you are doing right now to manage your diabetes?  Going to the gym 3-4 days per week silver sneakers classes, treadmill, weight machines (1 hours) and is walking most days.  She has reduced her portion sizes.  Frequently eats twice per day.  She has stopped eating desserts and does not like them currently.  What is the hardest part about your diabetes right now, causing you the most concern, or is the most worrisome to you about your diabetes?  What is my blood glucose doing?  Is it too high and I don't know it?  What questions do you have today?  About diet.  Can I have what I want at times if I watch my portions?  Have you participated in any diabetes support group?  none  History:  Type 2 Diabetes, HLD, HTN, hypothyroid, GERD A1C:  5.9% 10/2021 decreased from 6.7 % 04/30/2021 Medications include:  none for diabetes Sleep:  good (wakes 3-4 times per night to urinate) Weight:  134 lbs 12/02/2021 decreased from145 lbs 4/13/023 and 154 lbs 04/11/2021 Blood Glucose:  not testing at home    Have you had any low blood sugar readings in the past month?  none  Social History:  Patient lives with her husband.   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 Moved to [place] from [facility] 2 years ago.  Retired from [employer]. Exercise:  Going to the gym 3-4 days per week silver sneakers classes, treadmill, weight machines (1.5 hours) and is walking most days.  24 hour diet recall: Breakfast:  boiled egg, 1/2 cup greek yogurt, blueberries, occasional 1 strip bacon or sausage and rare fried  potatoes, V-8 vegetable juice at times Snack:  walnuts or grapes or 1/2 banana or orange Lunch:  grilled cheese sandwich OR BLT OR salad with grilled chicken with blue cheese dressing OR cheese sandwich or tuna sandwich or tuna and crackers or soup (vegetable beef soup), fruit Snack:  occasional walnuts or rare protein bar Dinner 6:00 pm:  baked or boiled or crock pot meat, rice or potatoes, or pasta, vegetables OR soup and crackers and salad OR Mexican bowl AND fruit Snack:  occasional fruit or walnuts (sometimes habit)  Beverages:  water, coffee with splenda   Specific focus but not limited to the following: Review A1C and that she meets criteria for Diabetes remission. Review My Plate and balance of meals Review of the effects of physical activity on glucose levels and long-term glucose control.  Encouraged continued exercise Reviewed need to eat for weight maintenance of 135-140 lbs.  Continuing Goals: Continue lifestyle habits:  exercise and mindful eating  Future Follow up:  She will continue to participate with the Type 2 Diabetes Support group as desired.  She is considering joining a DPP program with her husband who has prediabetes.

## 2021-12-10 DIAGNOSIS — L820 Inflamed seborrheic keratosis: Secondary | ICD-10-CM | POA: Diagnosis not present

## 2021-12-10 DIAGNOSIS — L821 Other seborrheic keratosis: Secondary | ICD-10-CM | POA: Diagnosis not present

## 2021-12-10 DIAGNOSIS — Z85828 Personal history of other malignant neoplasm of skin: Secondary | ICD-10-CM | POA: Diagnosis not present

## 2021-12-10 DIAGNOSIS — L570 Actinic keratosis: Secondary | ICD-10-CM | POA: Diagnosis not present

## 2021-12-10 DIAGNOSIS — I788 Other diseases of capillaries: Secondary | ICD-10-CM | POA: Diagnosis not present

## 2022-03-13 DIAGNOSIS — D485 Neoplasm of uncertain behavior of skin: Secondary | ICD-10-CM | POA: Diagnosis not present

## 2022-03-13 DIAGNOSIS — D2239 Melanocytic nevi of other parts of face: Secondary | ICD-10-CM | POA: Diagnosis not present

## 2022-04-25 DIAGNOSIS — E559 Vitamin D deficiency, unspecified: Secondary | ICD-10-CM | POA: Diagnosis not present

## 2022-04-25 DIAGNOSIS — R7989 Other specified abnormal findings of blood chemistry: Secondary | ICD-10-CM | POA: Diagnosis not present

## 2022-04-25 DIAGNOSIS — E785 Hyperlipidemia, unspecified: Secondary | ICD-10-CM | POA: Diagnosis not present

## 2022-04-25 DIAGNOSIS — E039 Hypothyroidism, unspecified: Secondary | ICD-10-CM | POA: Diagnosis not present

## 2022-04-25 DIAGNOSIS — R7303 Prediabetes: Secondary | ICD-10-CM | POA: Diagnosis not present

## 2022-05-02 DIAGNOSIS — M8580 Other specified disorders of bone density and structure, unspecified site: Secondary | ICD-10-CM | POA: Diagnosis not present

## 2022-05-02 DIAGNOSIS — Z0000 Encounter for general adult medical examination without abnormal findings: Secondary | ICD-10-CM | POA: Diagnosis not present

## 2022-05-02 DIAGNOSIS — E039 Hypothyroidism, unspecified: Secondary | ICD-10-CM | POA: Diagnosis not present

## 2022-05-02 DIAGNOSIS — N1832 Chronic kidney disease, stage 3b: Secondary | ICD-10-CM | POA: Diagnosis not present

## 2022-05-02 DIAGNOSIS — Z1331 Encounter for screening for depression: Secondary | ICD-10-CM | POA: Diagnosis not present

## 2022-05-02 DIAGNOSIS — E559 Vitamin D deficiency, unspecified: Secondary | ICD-10-CM | POA: Diagnosis not present

## 2022-05-02 DIAGNOSIS — E785 Hyperlipidemia, unspecified: Secondary | ICD-10-CM | POA: Diagnosis not present

## 2022-05-02 DIAGNOSIS — E1169 Type 2 diabetes mellitus with other specified complication: Secondary | ICD-10-CM | POA: Diagnosis not present

## 2022-07-14 IMAGING — MG MM DIGITAL SCREENING BILAT W/ TOMO AND CAD
8 series · 8 of 24 positions shown · non-contrast
Comparison: Previous exam(s).

CLINICAL DATA: Screening.

EXAM:
DIGITAL SCREENING BILATERAL MAMMOGRAM WITH TOMOSYNTHESIS AND CAD
TECHNIQUE: Bilateral screening digital craniocaudal and mediolateral oblique
mammograms were obtained. Bilateral screening digital breast
tomosynthesis was performed. The images were evaluated with
computer-aided detection.

[R MLO synth-2D]
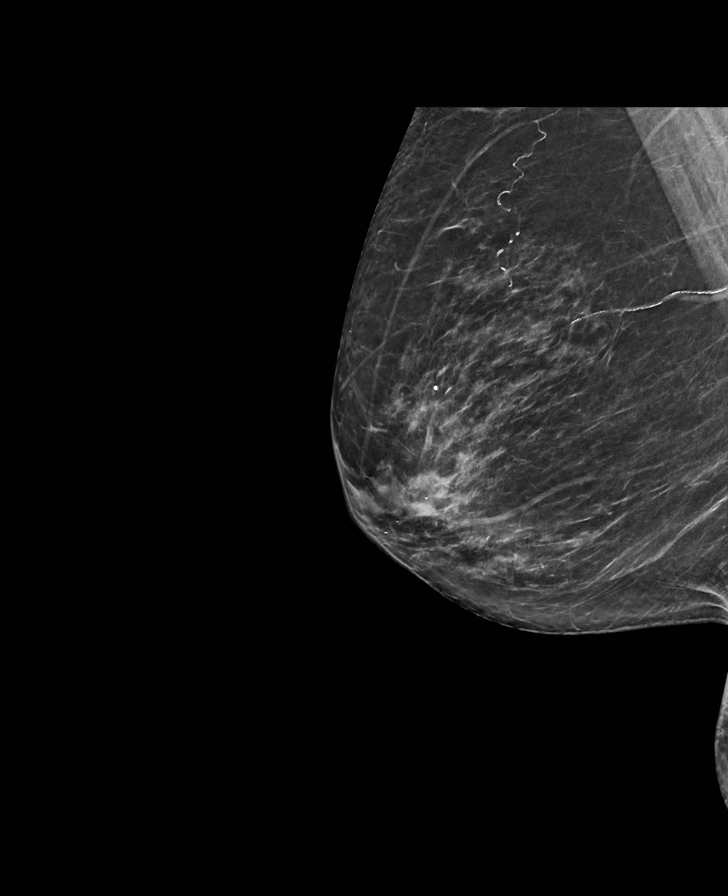

[L CC synth-2D]
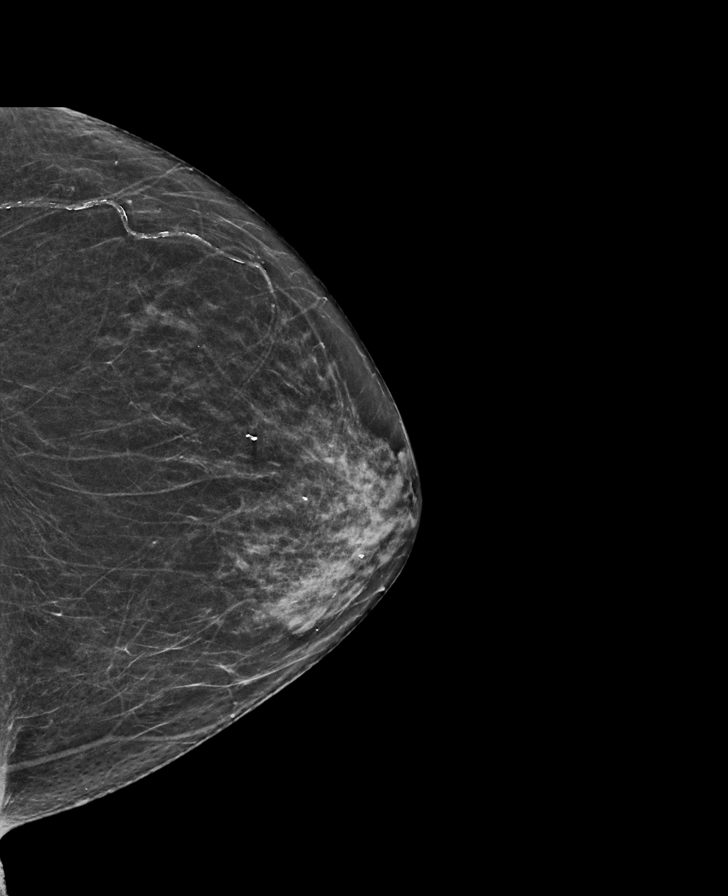

[R CC synth-2D]
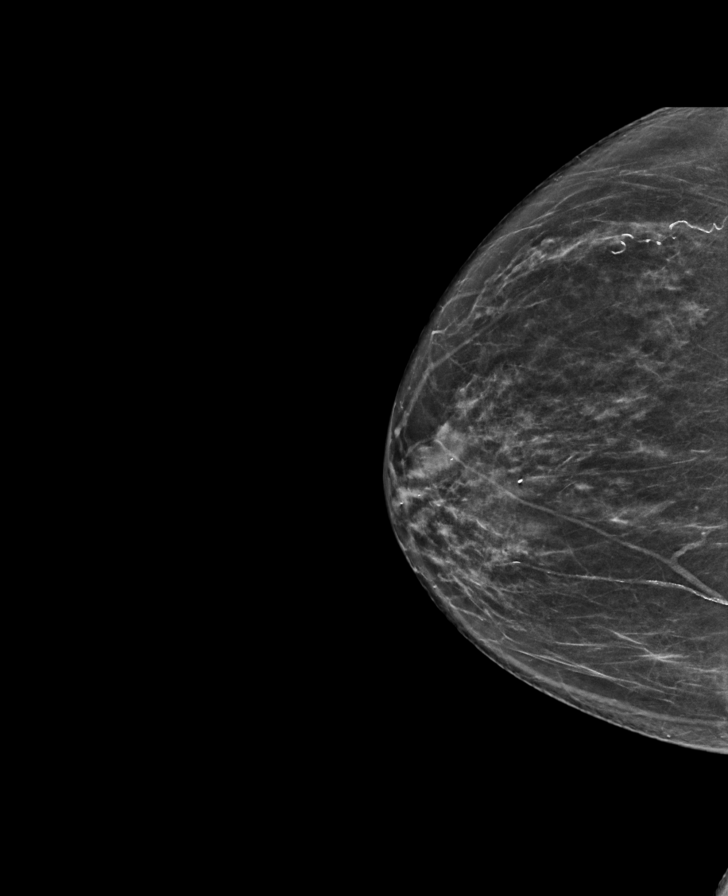

[L MLO synth-2D]
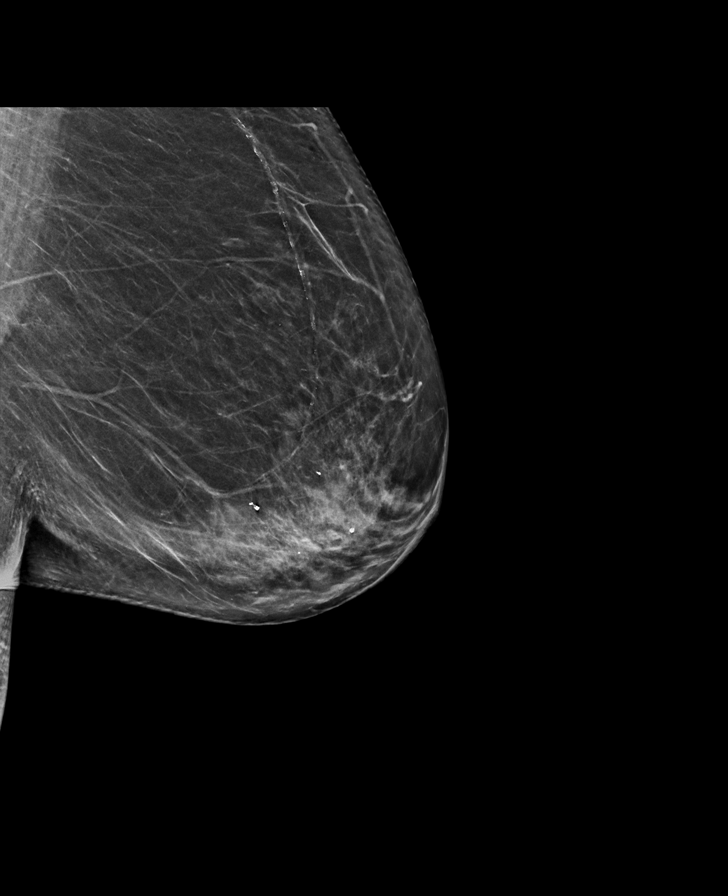

[L MLO tomo · tomo slice 35/68.0]
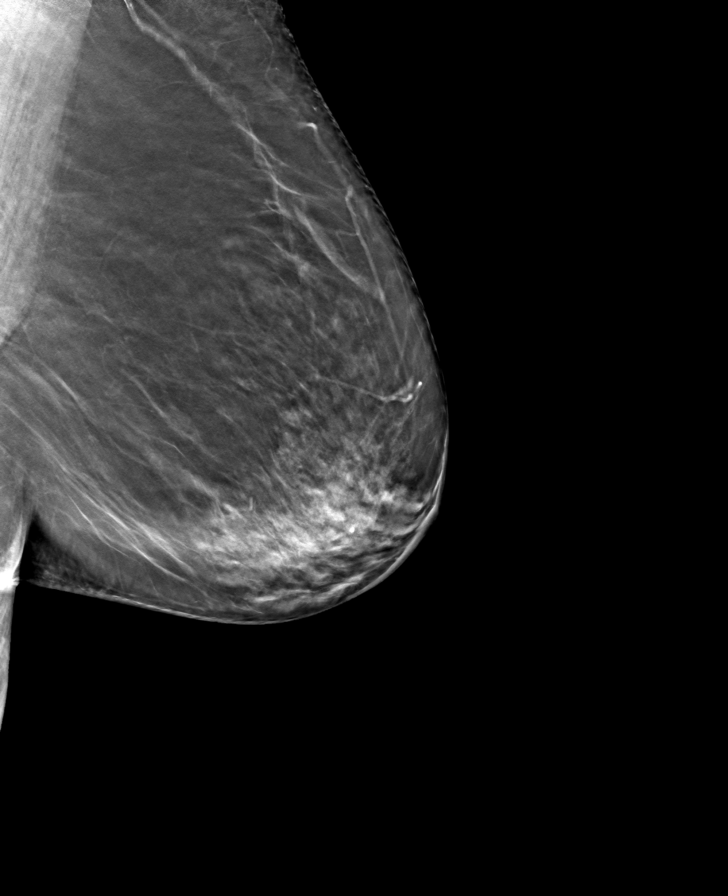

[L CC tomo · tomo slice 27/54.0]
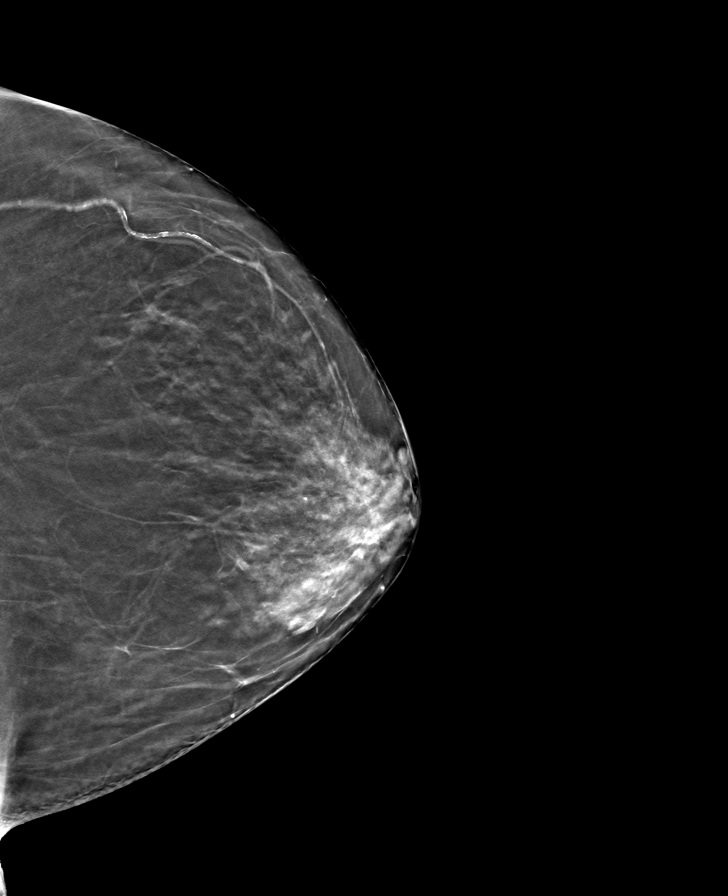

[R CC tomo · tomo slice 31/61.0]
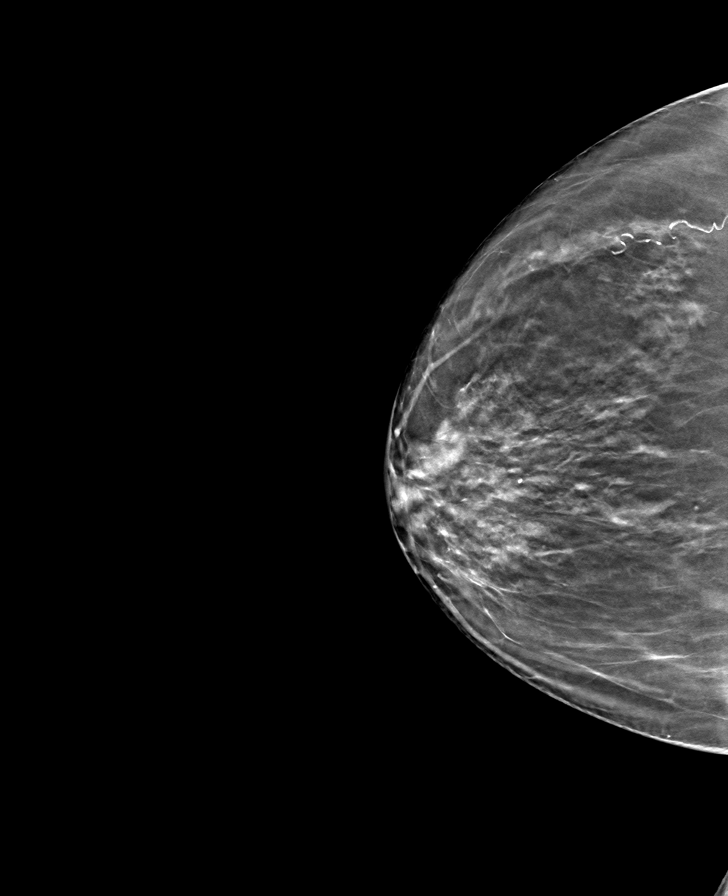

[R MLO tomo · tomo slice 33/65.0]
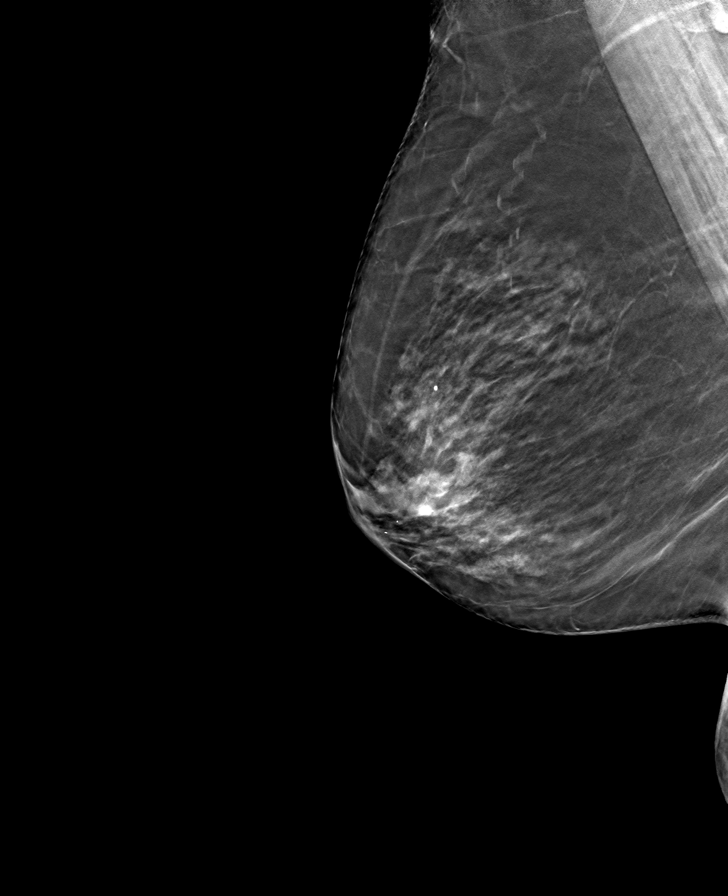

[8 of 24 positions shown; findings below may reference images not displayed]

ACR Breast Density Category b: There are scattered areas of
fibroglandular density.
FINDINGS: There are no findings suspicious for malignancy. The images were
evaluated with computer-aided detection.
IMPRESSION: No mammographic evidence of malignancy. A result letter of this
screening mammogram will be mailed directly to the patient.

RECOMMENDATION:
Screening mammogram in one year. (Code:WJ-[id number])

BI-RADS CATEGORY  1: Negative.

## 2022-07-21 DIAGNOSIS — I788 Other diseases of capillaries: Secondary | ICD-10-CM | POA: Diagnosis not present

## 2022-07-21 DIAGNOSIS — L821 Other seborrheic keratosis: Secondary | ICD-10-CM | POA: Diagnosis not present

## 2022-07-21 DIAGNOSIS — L304 Erythema intertrigo: Secondary | ICD-10-CM | POA: Diagnosis not present

## 2022-07-21 DIAGNOSIS — I8391 Asymptomatic varicose veins of right lower extremity: Secondary | ICD-10-CM | POA: Diagnosis not present

## 2022-07-21 DIAGNOSIS — D2272 Melanocytic nevi of left lower limb, including hip: Secondary | ICD-10-CM | POA: Diagnosis not present

## 2022-07-21 DIAGNOSIS — D2271 Melanocytic nevi of right lower limb, including hip: Secondary | ICD-10-CM | POA: Diagnosis not present

## 2022-07-21 DIAGNOSIS — L820 Inflamed seborrheic keratosis: Secondary | ICD-10-CM | POA: Diagnosis not present

## 2022-07-31 DIAGNOSIS — M2042 Other hammer toe(s) (acquired), left foot: Secondary | ICD-10-CM | POA: Diagnosis not present

## 2022-07-31 DIAGNOSIS — M2012 Hallux valgus (acquired), left foot: Secondary | ICD-10-CM | POA: Diagnosis not present

## 2022-07-31 DIAGNOSIS — M2041 Other hammer toe(s) (acquired), right foot: Secondary | ICD-10-CM | POA: Diagnosis not present

## 2022-07-31 DIAGNOSIS — M2011 Hallux valgus (acquired), right foot: Secondary | ICD-10-CM | POA: Diagnosis not present

## 2022-07-31 DIAGNOSIS — I70203 Unspecified atherosclerosis of native arteries of extremities, bilateral legs: Secondary | ICD-10-CM | POA: Diagnosis not present

## 2022-08-05 ENCOUNTER — Other Ambulatory Visit: Payer: Self-pay | Admitting: Internal Medicine

## 2022-08-05 DIAGNOSIS — Z1231 Encounter for screening mammogram for malignant neoplasm of breast: Secondary | ICD-10-CM

## 2022-08-21 DIAGNOSIS — E119 Type 2 diabetes mellitus without complications: Secondary | ICD-10-CM | POA: Diagnosis not present

## 2022-08-21 DIAGNOSIS — Z961 Presence of intraocular lens: Secondary | ICD-10-CM | POA: Diagnosis not present

## 2022-08-21 DIAGNOSIS — H35351 Cystoid macular degeneration, right eye: Secondary | ICD-10-CM | POA: Diagnosis not present

## 2022-09-15 ENCOUNTER — Ambulatory Visit
Admission: RE | Admit: 2022-09-15 | Discharge: 2022-09-15 | Disposition: A | Discharge status: Home / Self Care | Payer: Medicare PPO | Source: Ambulatory Visit | Attending: Internal Medicine | Admitting: Internal Medicine

## 2022-09-15 DIAGNOSIS — Z1231 Encounter for screening mammogram for malignant neoplasm of breast: Secondary | ICD-10-CM

## 2022-10-17 DIAGNOSIS — E785 Hyperlipidemia, unspecified: Secondary | ICD-10-CM | POA: Diagnosis not present

## 2022-10-17 DIAGNOSIS — E039 Hypothyroidism, unspecified: Secondary | ICD-10-CM | POA: Diagnosis not present

## 2022-10-17 DIAGNOSIS — M8580 Other specified disorders of bone density and structure, unspecified site: Secondary | ICD-10-CM | POA: Diagnosis not present

## 2022-10-17 DIAGNOSIS — N1832 Chronic kidney disease, stage 3b: Secondary | ICD-10-CM | POA: Diagnosis not present

## 2022-10-17 DIAGNOSIS — E559 Vitamin D deficiency, unspecified: Secondary | ICD-10-CM | POA: Diagnosis not present

## 2022-10-17 DIAGNOSIS — E1169 Type 2 diabetes mellitus with other specified complication: Secondary | ICD-10-CM | POA: Diagnosis not present

## 2023-02-25 DIAGNOSIS — H02881 Meibomian gland dysfunction right upper eyelid: Secondary | ICD-10-CM | POA: Diagnosis not present

## 2023-02-25 DIAGNOSIS — E119 Type 2 diabetes mellitus without complications: Secondary | ICD-10-CM | POA: Diagnosis not present

## 2023-02-25 DIAGNOSIS — H35351 Cystoid macular degeneration, right eye: Secondary | ICD-10-CM | POA: Diagnosis not present

## 2023-05-01 DIAGNOSIS — E1169 Type 2 diabetes mellitus with other specified complication: Secondary | ICD-10-CM | POA: Diagnosis not present

## 2023-05-01 DIAGNOSIS — E039 Hypothyroidism, unspecified: Secondary | ICD-10-CM | POA: Diagnosis not present

## 2023-05-01 DIAGNOSIS — E785 Hyperlipidemia, unspecified: Secondary | ICD-10-CM | POA: Diagnosis not present

## 2023-05-01 DIAGNOSIS — M8580 Other specified disorders of bone density and structure, unspecified site: Secondary | ICD-10-CM | POA: Diagnosis not present

## 2023-05-01 DIAGNOSIS — E559 Vitamin D deficiency, unspecified: Secondary | ICD-10-CM | POA: Diagnosis not present

## 2023-05-08 DIAGNOSIS — N1832 Chronic kidney disease, stage 3b: Secondary | ICD-10-CM | POA: Diagnosis not present

## 2023-05-08 DIAGNOSIS — E559 Vitamin D deficiency, unspecified: Secondary | ICD-10-CM | POA: Diagnosis not present

## 2023-05-08 DIAGNOSIS — M8580 Other specified disorders of bone density and structure, unspecified site: Secondary | ICD-10-CM | POA: Diagnosis not present

## 2023-05-08 DIAGNOSIS — E785 Hyperlipidemia, unspecified: Secondary | ICD-10-CM | POA: Diagnosis not present

## 2023-05-08 DIAGNOSIS — Z1331 Encounter for screening for depression: Secondary | ICD-10-CM | POA: Diagnosis not present

## 2023-05-08 DIAGNOSIS — E1169 Type 2 diabetes mellitus with other specified complication: Secondary | ICD-10-CM | POA: Diagnosis not present

## 2023-05-08 DIAGNOSIS — Z0000 Encounter for general adult medical examination without abnormal findings: Secondary | ICD-10-CM | POA: Diagnosis not present

## 2023-05-08 DIAGNOSIS — E039 Hypothyroidism, unspecified: Secondary | ICD-10-CM | POA: Diagnosis not present

## 2023-06-03 DIAGNOSIS — E119 Type 2 diabetes mellitus without complications: Secondary | ICD-10-CM | POA: Diagnosis not present

## 2023-06-03 DIAGNOSIS — H35351 Cystoid macular degeneration, right eye: Secondary | ICD-10-CM | POA: Diagnosis not present

## 2023-08-03 DIAGNOSIS — Z85828 Personal history of other malignant neoplasm of skin: Secondary | ICD-10-CM | POA: Diagnosis not present

## 2023-08-03 DIAGNOSIS — R208 Other disturbances of skin sensation: Secondary | ICD-10-CM | POA: Diagnosis not present

## 2023-08-03 DIAGNOSIS — D2272 Melanocytic nevi of left lower limb, including hip: Secondary | ICD-10-CM | POA: Diagnosis not present

## 2023-08-03 DIAGNOSIS — L821 Other seborrheic keratosis: Secondary | ICD-10-CM | POA: Diagnosis not present

## 2023-08-06 ENCOUNTER — Other Ambulatory Visit: Payer: Self-pay | Admitting: Internal Medicine

## 2023-08-06 DIAGNOSIS — Z1231 Encounter for screening mammogram for malignant neoplasm of breast: Secondary | ICD-10-CM

## 2023-09-16 ENCOUNTER — Ambulatory Visit
Admission: RE | Admit: 2023-09-16 | Discharge: 2023-09-16 | Disposition: A | Discharge status: Home / Self Care | Source: Ambulatory Visit | Attending: Internal Medicine | Admitting: Internal Medicine

## 2023-09-16 DIAGNOSIS — Z1231 Encounter for screening mammogram for malignant neoplasm of breast: Secondary | ICD-10-CM

## 2023-09-30 DIAGNOSIS — E119 Type 2 diabetes mellitus without complications: Secondary | ICD-10-CM | POA: Diagnosis not present

## 2023-09-30 DIAGNOSIS — H5201 Hypermetropia, right eye: Secondary | ICD-10-CM | POA: Diagnosis not present

## 2023-09-30 DIAGNOSIS — H35351 Cystoid macular degeneration, right eye: Secondary | ICD-10-CM | POA: Diagnosis not present

## 2023-09-30 DIAGNOSIS — H02881 Meibomian gland dysfunction right upper eyelid: Secondary | ICD-10-CM | POA: Diagnosis not present

## 2023-10-07 DIAGNOSIS — H30041 Focal chorioretinal inflammation, macular or paramacular, right eye: Secondary | ICD-10-CM | POA: Diagnosis not present

## 2023-10-07 DIAGNOSIS — H43813 Vitreous degeneration, bilateral: Secondary | ICD-10-CM | POA: Diagnosis not present

## 2023-10-07 DIAGNOSIS — H35351 Cystoid macular degeneration, right eye: Secondary | ICD-10-CM | POA: Diagnosis not present

## 2023-10-23 DIAGNOSIS — H35351 Cystoid macular degeneration, right eye: Secondary | ICD-10-CM | POA: Diagnosis not present

## 2023-10-23 DIAGNOSIS — H30041 Focal chorioretinal inflammation, macular or paramacular, right eye: Secondary | ICD-10-CM | POA: Diagnosis not present

## 2023-11-13 DIAGNOSIS — H30041 Focal chorioretinal inflammation, macular or paramacular, right eye: Secondary | ICD-10-CM | POA: Diagnosis not present

## 2023-11-13 DIAGNOSIS — H35351 Cystoid macular degeneration, right eye: Secondary | ICD-10-CM | POA: Diagnosis not present

## 2023-11-13 DIAGNOSIS — H43813 Vitreous degeneration, bilateral: Secondary | ICD-10-CM | POA: Diagnosis not present

## 2023-11-16 DIAGNOSIS — E785 Hyperlipidemia, unspecified: Secondary | ICD-10-CM | POA: Diagnosis not present

## 2023-11-16 DIAGNOSIS — E559 Vitamin D deficiency, unspecified: Secondary | ICD-10-CM | POA: Diagnosis not present

## 2023-11-16 DIAGNOSIS — E039 Hypothyroidism, unspecified: Secondary | ICD-10-CM | POA: Diagnosis not present

## 2023-11-16 DIAGNOSIS — E1169 Type 2 diabetes mellitus with other specified complication: Secondary | ICD-10-CM | POA: Diagnosis not present

## 2023-11-16 DIAGNOSIS — N1832 Chronic kidney disease, stage 3b: Secondary | ICD-10-CM | POA: Diagnosis not present

## 2023-11-16 DIAGNOSIS — M8580 Other specified disorders of bone density and structure, unspecified site: Secondary | ICD-10-CM | POA: Diagnosis not present

## 2023-11-16 DIAGNOSIS — N183 Chronic kidney disease, stage 3 unspecified: Secondary | ICD-10-CM | POA: Diagnosis not present

## 2024-01-13 DIAGNOSIS — H43813 Vitreous degeneration, bilateral: Secondary | ICD-10-CM | POA: Diagnosis not present

## 2024-01-13 DIAGNOSIS — H35351 Cystoid macular degeneration, right eye: Secondary | ICD-10-CM | POA: Diagnosis not present

## 2024-01-13 DIAGNOSIS — H30041 Focal chorioretinal inflammation, macular or paramacular, right eye: Secondary | ICD-10-CM | POA: Diagnosis not present
